# Patient Record
Sex: Female | Born: 1950 | Race: White | Hispanic: No | Smoking: Never smoker
Health system: Southern US, Community
[De-identification: ages and names within clinical notes are randomized; demographics above are authoritative.]

## PROBLEM LIST (undated history)

## (undated) DIAGNOSIS — I10 Essential (primary) hypertension: Secondary | ICD-10-CM

## (undated) DIAGNOSIS — F419 Anxiety disorder, unspecified: Secondary | ICD-10-CM

## (undated) HISTORY — PX: DIAGNOSTIC LAPAROSCOPY: SUR761

## (undated) HISTORY — PX: CATARACT EXTRACTION: SUR2

---

## 2017-07-22 ENCOUNTER — Other Ambulatory Visit: Payer: Self-pay | Admitting: Family Medicine

## 2017-07-22 DIAGNOSIS — Z1231 Encounter for screening mammogram for malignant neoplasm of breast: Secondary | ICD-10-CM

## 2017-08-15 ENCOUNTER — Ambulatory Visit
Admission: RE | Admit: 2017-08-15 | Discharge: 2017-08-15 | Disposition: A | Discharge status: Home / Self Care | Payer: BC Managed Care – PPO | Source: Ambulatory Visit | Attending: Family Medicine | Admitting: Family Medicine

## 2017-08-15 DIAGNOSIS — R928 Other abnormal and inconclusive findings on diagnostic imaging of breast: Secondary | ICD-10-CM | POA: Insufficient documentation

## 2017-08-15 DIAGNOSIS — Z1231 Encounter for screening mammogram for malignant neoplasm of breast: Secondary | ICD-10-CM | POA: Insufficient documentation

## 2017-08-19 ENCOUNTER — Other Ambulatory Visit: Payer: Self-pay | Admitting: Family Medicine

## 2017-08-19 DIAGNOSIS — N6489 Other specified disorders of breast: Secondary | ICD-10-CM

## 2017-08-19 DIAGNOSIS — R928 Other abnormal and inconclusive findings on diagnostic imaging of breast: Secondary | ICD-10-CM

## 2017-08-29 ENCOUNTER — Ambulatory Visit
Admission: RE | Admit: 2017-08-29 | Discharge: 2017-08-29 | Disposition: A | Discharge status: Home / Self Care | Payer: BC Managed Care – PPO | Source: Ambulatory Visit | Attending: Family Medicine | Admitting: Family Medicine

## 2017-08-29 DIAGNOSIS — R928 Other abnormal and inconclusive findings on diagnostic imaging of breast: Secondary | ICD-10-CM | POA: Diagnosis not present

## 2017-08-29 DIAGNOSIS — N6489 Other specified disorders of breast: Secondary | ICD-10-CM

## 2017-10-10 ENCOUNTER — Ambulatory Visit
Admission: RE | Admit: 2017-10-10 | Payer: BC Managed Care – PPO | Source: Ambulatory Visit | Admitting: Gastroenterology

## 2017-10-10 ENCOUNTER — Encounter: Admission: RE | Payer: Self-pay | Source: Ambulatory Visit

## 2017-10-10 SURGERY — COLONOSCOPY WITH PROPOFOL
Anesthesia: General

## 2017-12-05 ENCOUNTER — Encounter
Admission: RE | Disposition: A | Discharge status: Home / Self Care | Payer: Self-pay | Source: Ambulatory Visit | Attending: Gastroenterology

## 2017-12-05 ENCOUNTER — Ambulatory Visit: Payer: BC Managed Care – PPO | Admitting: Anesthesiology

## 2017-12-05 ENCOUNTER — Encounter: Payer: Self-pay | Admitting: *Deleted

## 2017-12-05 ENCOUNTER — Ambulatory Visit
Admission: RE | Admit: 2017-12-05 | Discharge: 2017-12-05 | Disposition: A | Discharge status: Home / Self Care | Payer: BC Managed Care – PPO | Source: Ambulatory Visit | Attending: Gastroenterology | Admitting: Gastroenterology

## 2017-12-05 DIAGNOSIS — F419 Anxiety disorder, unspecified: Secondary | ICD-10-CM | POA: Diagnosis not present

## 2017-12-05 DIAGNOSIS — I10 Essential (primary) hypertension: Secondary | ICD-10-CM | POA: Diagnosis not present

## 2017-12-05 DIAGNOSIS — K640 First degree hemorrhoids: Secondary | ICD-10-CM | POA: Insufficient documentation

## 2017-12-05 DIAGNOSIS — K5730 Diverticulosis of large intestine without perforation or abscess without bleeding: Secondary | ICD-10-CM | POA: Diagnosis not present

## 2017-12-05 DIAGNOSIS — Z1211 Encounter for screening for malignant neoplasm of colon: Secondary | ICD-10-CM | POA: Diagnosis not present

## 2017-12-05 DIAGNOSIS — Z79899 Other long term (current) drug therapy: Secondary | ICD-10-CM | POA: Diagnosis not present

## 2017-12-05 HISTORY — PX: COLONOSCOPY WITH PROPOFOL: SHX5780

## 2017-12-05 HISTORY — DX: Essential (primary) hypertension: I10

## 2017-12-05 HISTORY — DX: Anxiety disorder, unspecified: F41.9

## 2017-12-05 SURGERY — COLONOSCOPY WITH PROPOFOL
Anesthesia: General

## 2017-12-05 MED ORDER — SODIUM CHLORIDE 0.9 % IV SOLN
INTRAVENOUS | Status: DC
  Administered 2017-12-05: 07:00:00 via INTRAVENOUS

## 2017-12-05 MED ORDER — PROPOFOL 500 MG/50ML IV EMUL
INTRAVENOUS | Status: AC
  Filled 2017-12-05: qty 50

## 2017-12-05 MED ORDER — PROPOFOL 10 MG/ML IV BOLUS
INTRAVENOUS | Status: DC | PRN
  Administered 2017-12-05: 60 mg via INTRAVENOUS
  Administered 2017-12-05: 40 mg via INTRAVENOUS

## 2017-12-05 MED ORDER — PROPOFOL 500 MG/50ML IV EMUL
INTRAVENOUS | Status: DC | PRN
  Administered 2017-12-05: 150 ug/kg/min via INTRAVENOUS

## 2017-12-05 NOTE — Anesthesia Preprocedure Evaluation (Addendum)
Anesthesia Evaluation  Patient identified by MRN, date of birth, ID band Patient awake    Reviewed: Allergy & Precautions, NPO status , Patient's Chart, lab work & pertinent test results  Airway Mallampati: IV  TM Distance: <3 FB     Dental  (+) Poor Dentition   Pulmonary neg pulmonary ROS,    Pulmonary exam normal        Cardiovascular hypertension, Normal cardiovascular exam     Neuro/Psych Anxiety negative neurological ROS     GI/Hepatic negative GI ROS, Neg liver ROS,   Endo/Other  negative endocrine ROS  Renal/GU negative Renal ROS  negative genitourinary   Musculoskeletal negative musculoskeletal ROS (+)   Abdominal Normal abdominal exam  (+)   Peds negative pediatric ROS (+)  Hematology negative hematology ROS (+)   Anesthesia Other Findings   Reproductive/Obstetrics                            Anesthesia Physical Anesthesia Plan  ASA: II  Anesthesia Plan: General   Post-op Pain Management:    Induction: Intravenous  PONV Risk Score and Plan: TIVA  Airway Management Planned: Nasal Cannula  Additional Equipment:   Intra-op Plan:   Post-operative Plan:   Informed Consent: I have reviewed the patients History and Physical, chart, labs and discussed the procedure including the risks, benefits and alternatives for the proposed anesthesia with the patient or authorized representative who has indicated his/her understanding and acceptance.   Dental advisory given  Plan Discussed with: CRNA and Surgeon  Anesthesia Plan Comments:         Anesthesia Quick Evaluation

## 2017-12-05 NOTE — Transfer of Care (Signed)
Immediate Anesthesia Transfer of Care Note  Patient: Caitlin Callahan  Procedure(s) Performed: COLONOSCOPY WITH PROPOFOL (N/A )  Patient Location: PACU  Anesthesia Type:General  Level of Consciousness: sedated  Airway & Oxygen Therapy: Patient Spontanous Breathing and Patient connected to nasal cannula oxygen  Post-op Assessment: Report given to RN and Post -op Vital signs reviewed and stable  Post vital signs: Reviewed and stable  Last Vitals:  Vitals Value Taken Time  BP 101/75 12/05/2017  8:02 AM  Temp 36.1 C 12/05/2017  8:01 AM  Pulse 65 12/05/2017  8:03 AM  Resp 17 12/05/2017  8:03 AM  SpO2 97 % 12/05/2017  8:03 AM  Vitals shown include unvalidated device data.  Last Pain:  Vitals:   12/05/17 0801  TempSrc: Tympanic         Complications: No apparent anesthesia complications

## 2017-12-05 NOTE — H&P (Signed)
Outpatient short stay form Pre-procedure 12/05/2017 7:16 AM Caitlin DeemMartin U Holton Sidman MD  Primary Physician: Dr Marisue IvanKanhka Linthavong  Reason for visit: Screening colonoscopy  History of present illness: Patient is a 67 year old female presenting today as above.  This is her first colonoscopy.  She takes no aspirin or blood thinning agent.  She tolerated her prep well.    Current Facility-Administered Medications:  .  0.9 %  sodium chloride infusion, , Intravenous, Continuous, Caitlin DeemSkulskie, Demaris Leavell U, MD  Medications Prior to Admission  Medication Sig Dispense Refill Last Dose  . amLODipine (NORVASC) 5 MG tablet Take 5 mg by mouth daily.   12/05/2017 at Unknown time  . mirtazapine (REMERON) 30 MG tablet Take 30 mg by mouth at bedtime.   12/04/2017 at Unknown time  . sertraline (ZOLOFT) 100 MG tablet Take 100 mg by mouth daily.   12/04/2017 at Unknown time  . Multiple Vitamin (MULTIVITAMIN WITH MINERALS) TABS tablet Take 1 tablet by mouth daily.   Not Taking at Unknown time     No Known Allergies   Past Medical History:  Diagnosis Date  . Anxiety   . Hypertension     Review of systems:      Physical Exam    Heart and lungs: Rhythm without rub or gallop, lungs are bilaterally clear    HEENT: Cephalic atraumatic eyes are anicteric    Other:    Pertinant exam for procedure: Soft nontender nondistended bowel sounds positive normoactive    Planned proceedures: Colonoscopy and indicated procedures. I have discussed the risks benefits and complications of procedures to include not limited to bleeding, infection, perforation and the risk of sedation and the patient wishes to proceed.    Caitlin DeemMartin U Miracle Mongillo, MD Gastroenterology 12/05/2017  7:16 AM

## 2017-12-05 NOTE — Op Note (Addendum)
[facility] Gastroenterology Patient Name: Caitlin MeoJulie Gallen Procedure Date: 12/05/2017 7:34 AM MRN: [id number] Account #: [id number] Date of Birth: 08/23/1950 Admit Type: Outpatient Age: [age] [facility] 3 Gender: Female Note Status: Supervisor Override Procedure:            Colonoscopy Indications:          Screening for colorectal malignant neoplasm Providers:            Christena DeemMartin U. Skulskie, MD Referring MD:         Marisue IvanKanhka Linthavong (Referring MD) Medicines:            Monitored Anesthesia Care Complications:        No immediate complications. Procedure:            Pre-Anesthesia Assessment:                       - ASA Grade Assessment: III - A patient with severe                        systemic disease.                       After obtaining informed consent, the colonoscope was                        passed under direct vision. Throughout the procedure,                        the patient's blood pressure, pulse, and oxygen                        saturations were monitored continuously. The                        Colonoscope was introduced through the anus and                        advanced to the the cecum, identified by appendiceal                        orifice and ileocecal valve. The colonoscopy was                        performed with moderate difficulty due to multiple                        diverticula in the colon and poor bowel prep with stool                        present. Successful completion of the procedure was                        aided by changing the patient to a supine position and                        lavage. The patient tolerated the procedure well. The                        quality of the bowel preparation was good except the  sigmoid colon was fair. Findings:      Many small and large-mouthed diverticula were found in the sigmoid       colon, descending colon, transverse colon and ascending colon. Multiple        diverticuli (edited 12/16/2017-MUS) in the sigmoid colon were inspissated       with stool.      Non-bleeding internal hemorrhoids were found during retroflexion and       during anoscopy. The hemorrhoids were medium-sized and Grade I (internal       hemorrhoids that do not prolapse).      No additional abnormalities were found on retroflexion.      The digital rectal exam was normal. Impression:           - Diverticulosis in the sigmoid colon, in the                        descending colon, in the transverse colon and in the                        ascending colon.                       - Non-bleeding internal hemorrhoids.                       - No specimens collected. Recommendation:       - Discharge patient to home. Procedure Code(s):    --- Professional ---                       [phone], Colonoscopy, flexible; diagnostic, including                        collection of specimen(s) by brushing or washing, when                        performed (separate procedure) Diagnosis Code(s):    --- Professional ---                       Z12.11, Encounter for screening for malignant neoplasm                        of colon                       K64.0, First degree hemorrhoids                       K57.30, Diverticulosis of large intestine without                        perforation or abscess without bleeding CPT copyright 2017 [employer]. All rights reserved. The codes documented in this report are preliminary and upon coder review may  be revised to meet current compliance requirements. Christena DeemMartin U Skulskie, MD 12/05/2017 8:04:07 AM This report has been signed electronically. Number of Addenda: 0 Note Initiated On: 12/05/2017 7:34 AM Scope Withdrawal Time: 0 hours 9 minutes 53 seconds  Total Procedure Duration: 0 hours 17 minutes 51 seconds       [facility]

## 2017-12-05 NOTE — Anesthesia Procedure Notes (Signed)
Date/Time: 12/05/2017 7:42 AM Performed by: Junious SilkNoles, Ambyr Qadri, CRNA Pre-anesthesia Checklist: Patient identified, Emergency Drugs available, Suction available, Patient being monitored and Timeout performed Oxygen Delivery Method: Nasal cannula

## 2017-12-05 NOTE — Anesthesia Post-op Follow-up Note (Signed)
Anesthesia QCDR form completed.        

## 2017-12-06 NOTE — Anesthesia Postprocedure Evaluation (Signed)
Anesthesia Post Note  Patient: Caitlin Callahan  Procedure(s) Performed: COLONOSCOPY WITH PROPOFOL (N/A )  Patient location during evaluation: PACU Anesthesia Type: General Level of consciousness: awake and alert and oriented Pain management: pain level controlled Vital Signs Assessment: post-procedure vital signs reviewed and stable Respiratory status: spontaneous breathing Cardiovascular status: blood pressure returned to baseline Anesthetic complications: no     Last Vitals:  Vitals:   12/05/17 0801 12/05/17 0811  BP: 101/75 (!) 145/86  Pulse:    Temp: (!) 36.1 C   SpO2: 97%     Last Pain:  Vitals:   12/05/17 0849  TempSrc:   PainSc: 0-No pain                 Caston Coopersmith

## 2017-12-08 ENCOUNTER — Encounter: Payer: Self-pay | Admitting: Gastroenterology

## 2018-03-10 DIAGNOSIS — F1011 Alcohol abuse, in remission: Secondary | ICD-10-CM | POA: Diagnosis not present

## 2018-03-10 DIAGNOSIS — F5105 Insomnia due to other mental disorder: Secondary | ICD-10-CM | POA: Diagnosis not present

## 2018-03-10 DIAGNOSIS — F321 Major depressive disorder, single episode, moderate: Secondary | ICD-10-CM | POA: Diagnosis not present

## 2018-03-10 DIAGNOSIS — F4322 Adjustment disorder with anxiety: Secondary | ICD-10-CM | POA: Diagnosis not present

## 2019-01-07 ENCOUNTER — Other Ambulatory Visit: Payer: Self-pay | Admitting: Family Medicine

## 2019-01-07 DIAGNOSIS — Z1231 Encounter for screening mammogram for malignant neoplasm of breast: Secondary | ICD-10-CM

## 2019-06-21 DEATH — deceased

## 2019-10-24 IMAGING — MG MM DIGITAL SCREENING BILAT W/ CAD
4 series · 4 of 4 positions shown · non-contrast
Comparison: None available.

CLINICAL DATA: Screening.

EXAM:
DIGITAL SCREENING BILATERAL MAMMOGRAM WITH CAD

[R CC]
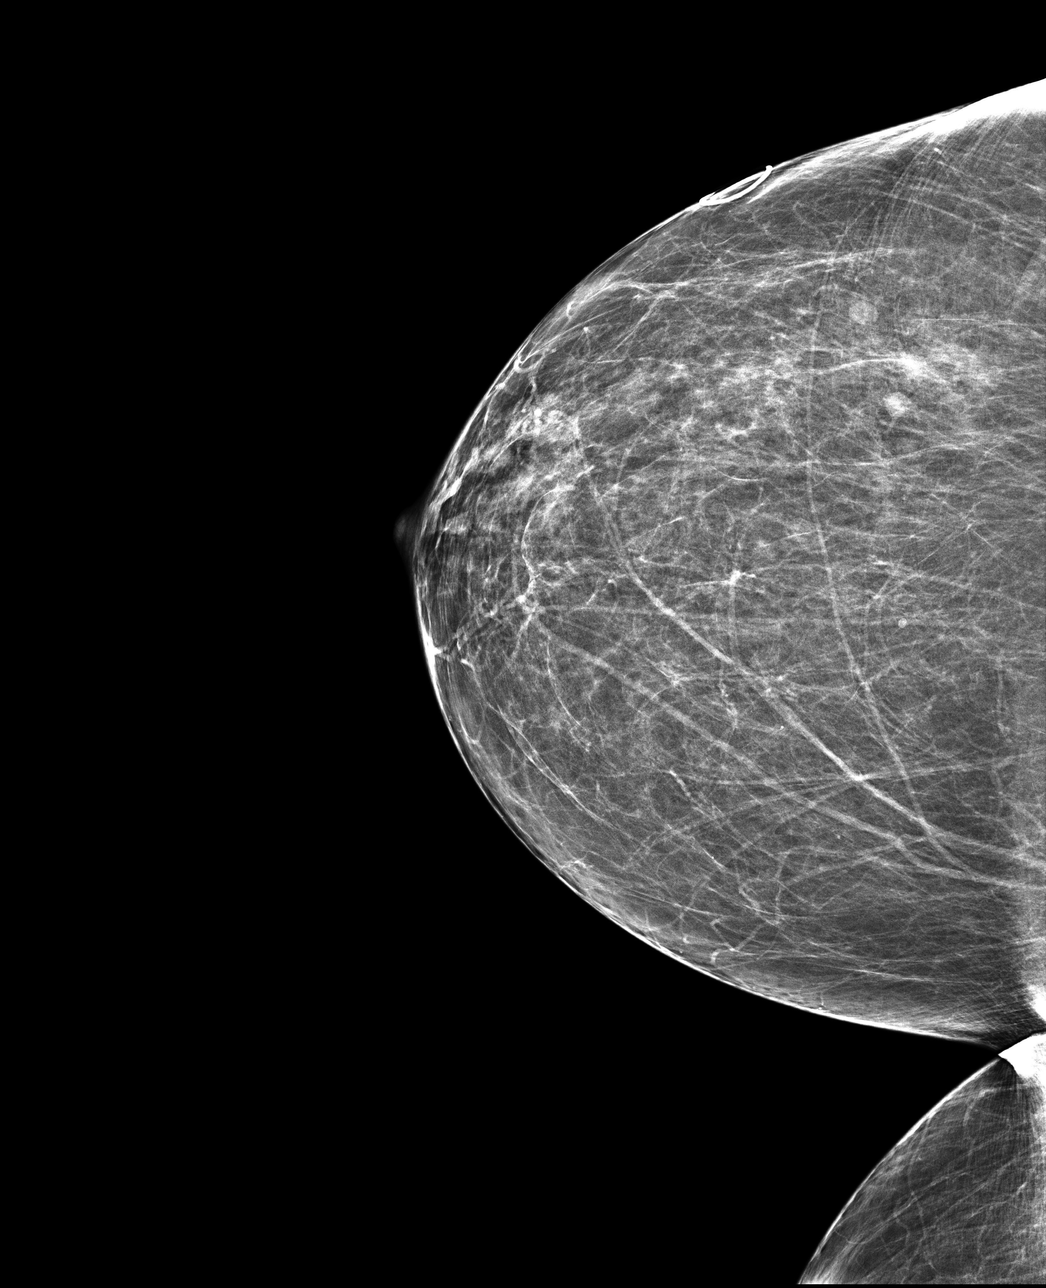

[R MLO]
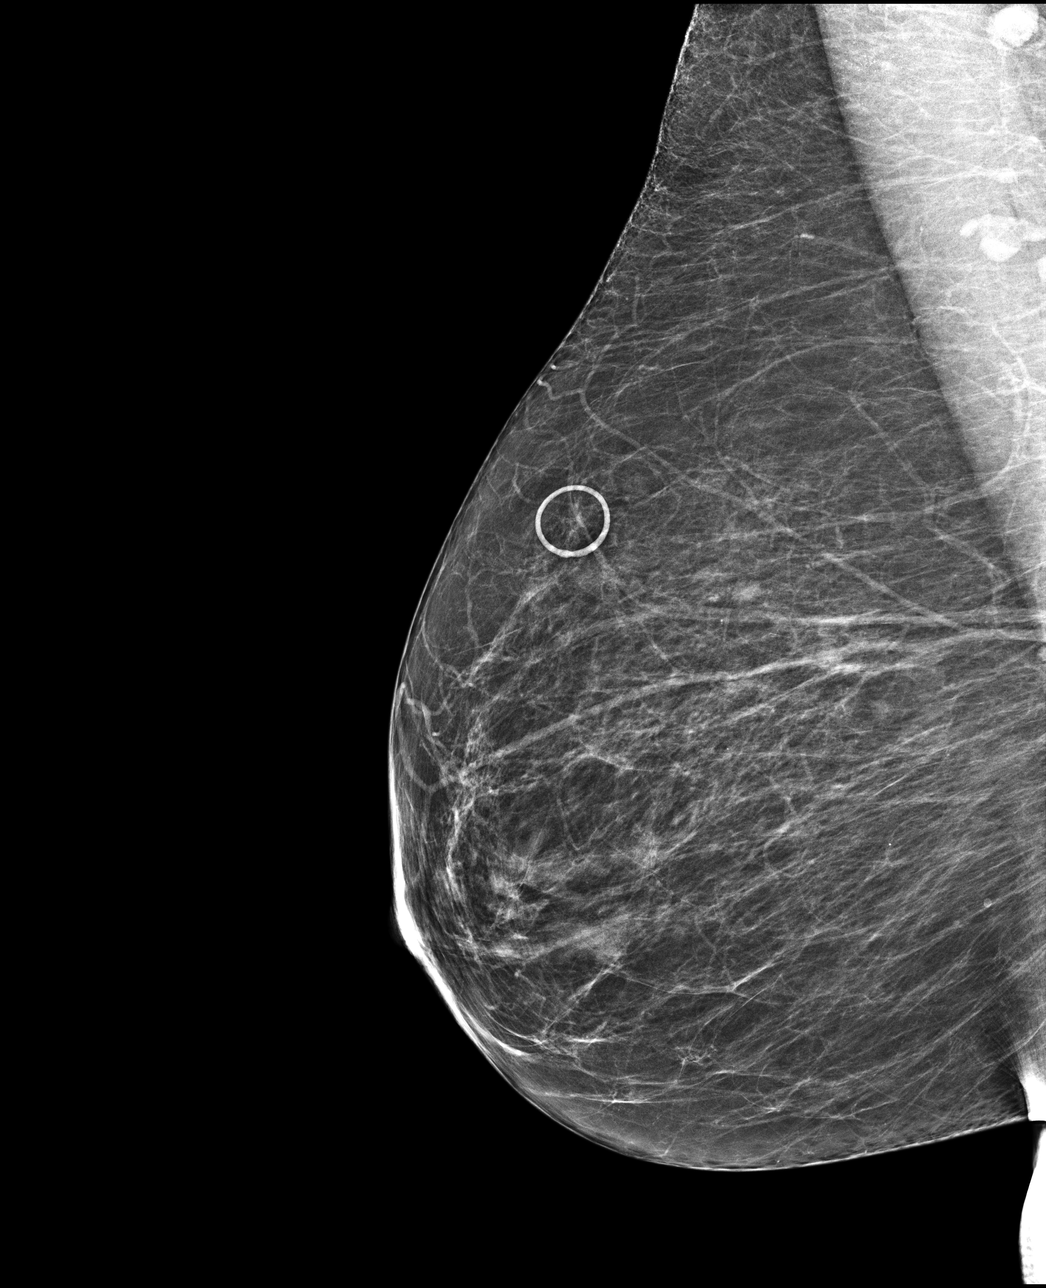

[L CC]
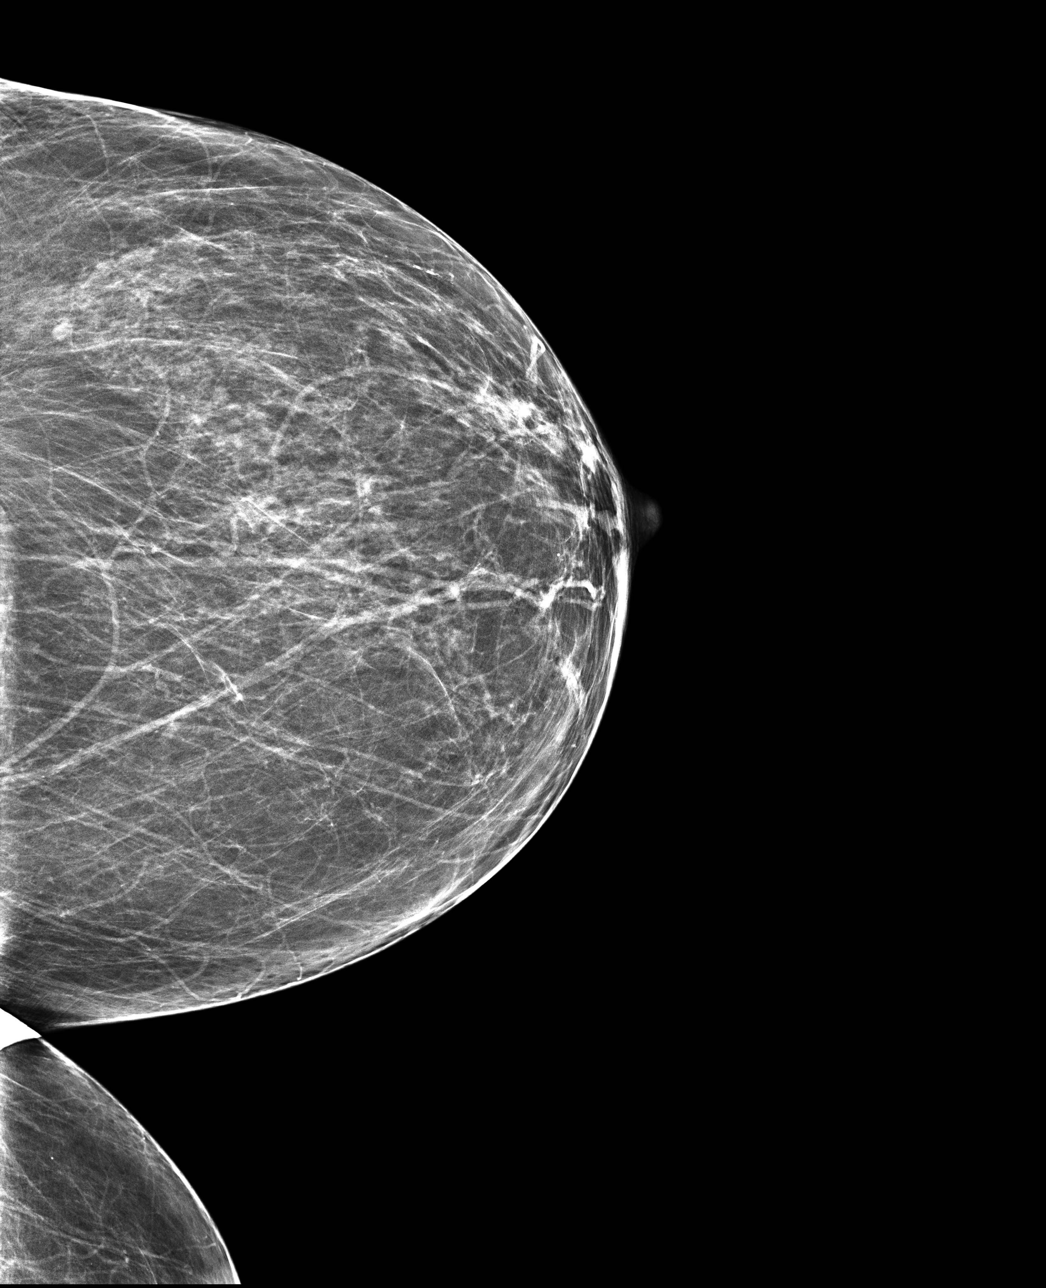

[L MLO]
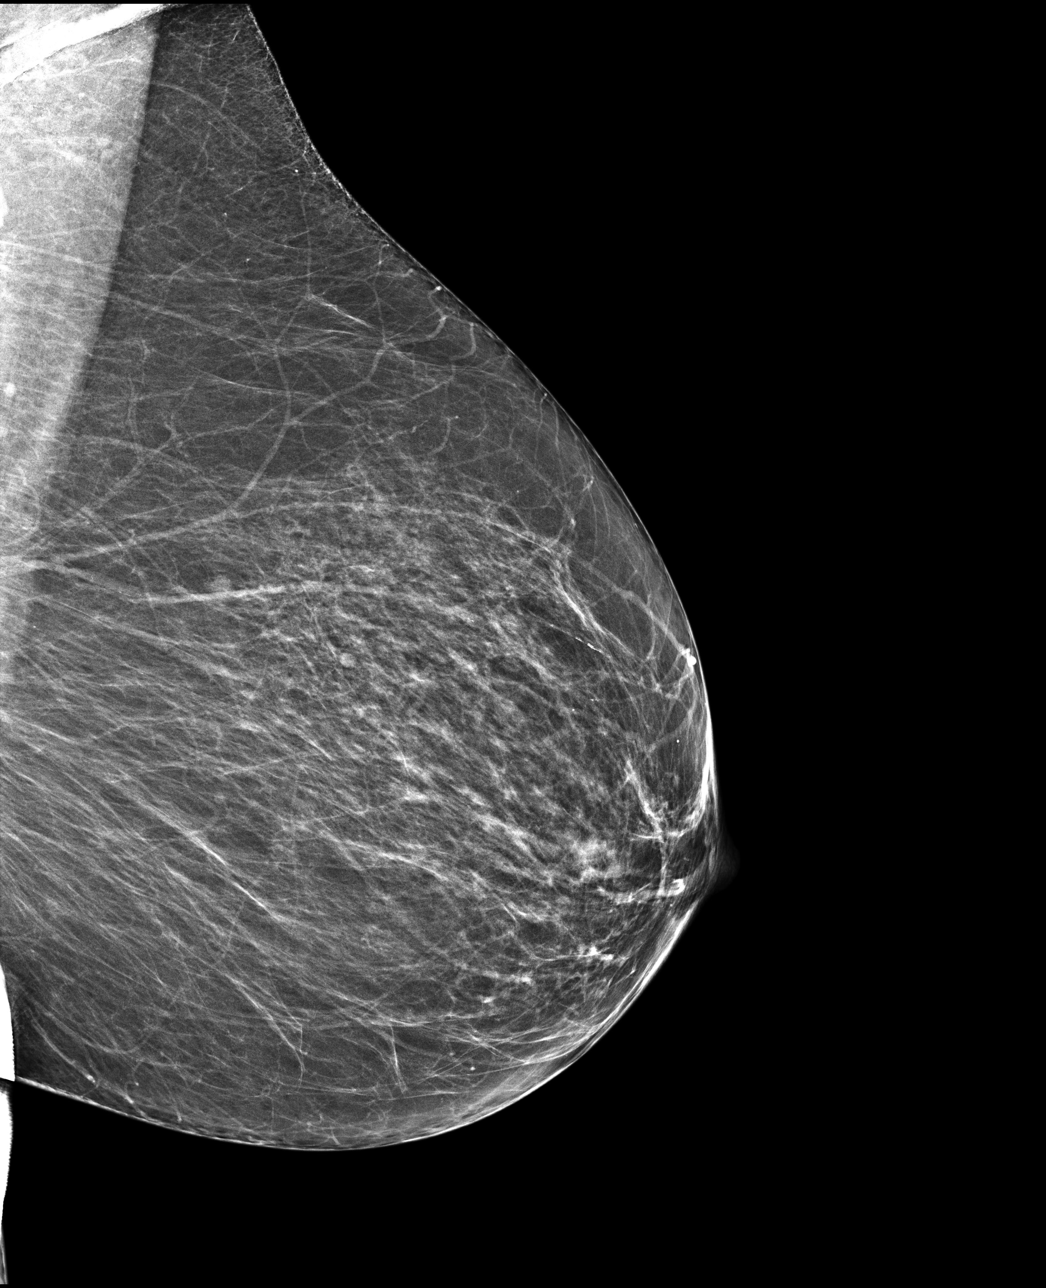

[4 of 4 positions shown; findings below may reference images not displayed]

ACR Breast Density Category b: There are scattered areas of
fibroglandular density.
FINDINGS: In the right breast, a possible asymmetry warrants further
evaluation. In the left breast, no findings suspicious for
malignancy. Images were processed with CAD.
IMPRESSION: Further evaluation is suggested for possible asymmetry in the right
breast.

RECOMMENDATION:
Diagnostic mammogram and possibly ultrasound of the right breast.
(Code:[id number])

The patient will be contacted regarding the findings, and additional
imaging will be scheduled.

BI-RADS CATEGORY  0: Incomplete. Need additional imaging evaluation
and/or prior mammograms for comparison.
